# Patient Record
Sex: Male | Born: 1995 | Race: White | Hispanic: No | Marital: Single | Smoking: Never smoker
Health system: Southern US, Community
[De-identification: ages and names within clinical notes are randomized; demographics above are authoritative.]

## PROBLEM LIST (undated history)

## (undated) DIAGNOSIS — F32A Depression, unspecified: Secondary | ICD-10-CM

## (undated) DIAGNOSIS — F329 Major depressive disorder, single episode, unspecified: Secondary | ICD-10-CM

## (undated) DIAGNOSIS — F988 Other specified behavioral and emotional disorders with onset usually occurring in childhood and adolescence: Secondary | ICD-10-CM

## (undated) HISTORY — DX: Depression, unspecified: F32.A

## (undated) HISTORY — DX: Other specified behavioral and emotional disorders with onset usually occurring in childhood and adolescence: F98.8

## (undated) HISTORY — PX: SHOULDER ARTHROSCOPY WITH BICEPS TENDON REPAIR: SHX5674

---
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
## 2017-05-07 ENCOUNTER — Encounter: Payer: Self-pay | Admitting: *Deleted

## 2017-05-07 ENCOUNTER — Emergency Department (INDEPENDENT_AMBULATORY_CARE_PROVIDER_SITE_OTHER)
Admission: EM | Admit: 2017-05-07 | Discharge: 2017-05-07 | Disposition: A | Discharge status: Home / Self Care | Payer: BLUE CROSS/BLUE SHIELD | Source: Home / Self Care

## 2017-05-07 DIAGNOSIS — Z23 Encounter for immunization: Secondary | ICD-10-CM

## 2017-05-07 MED ORDER — TETANUS-DIPHTH-ACELL PERTUSSIS 5-2.5-18.5 LF-MCG/0.5 IM SUSP
0.5000 mL | Freq: Once | INTRAMUSCULAR | Status: AC
  Administered 2017-05-07: 0.5 mL via INTRAMUSCULAR

## 2017-05-07 NOTE — ED Triage Notes (Signed)
Trinna Postlex is here today for Tdap vaccine.

## 2017-05-07 NOTE — ED Notes (Signed)
Pt reports that he stepped on a nail. Advised him to be sure to clean his wound with soap and water daily, and to apply ABT. If he notices any s/s of infection return to the clinic for evaluation. Pt verbalized understanding.

## 2018-05-20 ENCOUNTER — Other Ambulatory Visit: Payer: Self-pay

## 2018-05-20 ENCOUNTER — Emergency Department (INDEPENDENT_AMBULATORY_CARE_PROVIDER_SITE_OTHER)
Admission: EM | Admit: 2018-05-20 | Discharge: 2018-05-20 | Disposition: A | Discharge status: Home / Self Care | Payer: Self-pay | Source: Home / Self Care | Attending: Family Medicine | Admitting: Family Medicine

## 2018-05-20 ENCOUNTER — Emergency Department (INDEPENDENT_AMBULATORY_CARE_PROVIDER_SITE_OTHER): Payer: 59

## 2018-05-20 DIAGNOSIS — R079 Chest pain, unspecified: Secondary | ICD-10-CM

## 2018-05-20 MED ORDER — NAPROXEN 500 MG PO TABS
500.0000 mg | ORAL_TABLET | Freq: Two times a day (BID) | ORAL | 0 refills | Status: DC
Start: 2018-05-20 — End: 2019-04-05

## 2018-05-20 NOTE — ED Triage Notes (Signed)
Pt stated that around 2 pm today his chest started hurting bad on the left side.  He felt like it was hard to take a deep breath.  Denies any history of heart issues.

## 2018-05-20 NOTE — ED Provider Notes (Signed)
[facility]    CSN: [id number] Arrival date & time: 05/20/18  1807     History   Chief Complaint Chief Complaint  Patient presents with  . Chest Pain    HPI Aneesh Faller is a 22 y.o. male.   HPI  Devyn Sheerin is a 22 y.o. male presenting to UC with c/o chest pain that started with a dull ache this morning and worsened around 2PM this afternoon. Pain is aching and sharp with deep breathing, bending over and certain movements or positions. He was able to nap after taking some acetaminophen but pain is still present after the nap. No known heart problems. Denies SOB. Hx of similar pain for a few hours as a child but it resolved on its own.  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 Denies working harder or doing anything different than usual. Denies cough or congestion. Pain is mild at this time but it was severe around 2PM.   History reviewed. No pertinent past medical history.  There are no active problems to display for this patient.   Past Surgical History:  Procedure Laterality Date  . SHOULDER ARTHROSCOPY WITH BICEPS TENDON REPAIR         Home Medications    Prior to Admission medications   Medication Sig Start Date End Date Taking? Authorizing Provider  meloxicam (MOBIC) 15 MG tablet Take 1 tablet (15 mg total) by mouth daily. Take with breakfast 09/13/13   Lattie Haw, MD  naproxen (NAPROSYN) 500 MG tablet Take 1 tablet (500 mg total) by mouth 2 (two) times daily. 05/20/18   Lurene Shadow, PA-C    Family History Family History  Problem Relation Age of Onset  . Hemachromatosis Father     Social History Social History   Tobacco Use  . Smoking status: Never Smoker  Substance Use Topics  . Alcohol use: No  . Drug use: Not on file     Allergies   Patient has no known allergies.   Review of Systems Review of Systems  Constitutional: Negative for chills, diaphoresis, fatigue and fever.  HENT: Negative for congestion and  sore throat.   Respiratory: Negative for chest tightness, shortness of breath and wheezing.   Cardiovascular: Positive for chest pain. Negative for palpitations and leg swelling.  Gastrointestinal: Negative for abdominal pain, diarrhea, nausea and vomiting.  Musculoskeletal: Positive for back pain and myalgias. Negative for neck pain and neck stiffness.  Skin: Negative for rash.     Physical Exam Triage Vital Signs ED Triage Vitals  Enc Vitals Group     BP 05/20/18 1815 124/75     Pulse Rate 05/20/18 1815 70     Resp --      Temp 05/20/18 1815 98.3 F (36.8 C)     Temp Source 05/20/18 1815 Oral     SpO2 05/20/18 1815 98 %     Weight --      Height --      Head Circumference --      Peak Flow --      Pain Score 05/20/18 1816 4     Pain Loc --      Pain Edu? --      Excl. in GC? --    No data found.  Updated Vital Signs BP 124/75 (BP Location: Right Arm)   Pulse 70   Temp 98.3 F (36.8 C) (Oral)   SpO2 98%   Visual Acuity Right Eye Distance:  Left Eye Distance:   Bilateral Distance:    Right Eye Near:   Left Eye Near:    Bilateral Near:     Physical Exam  Constitutional: He is oriented to person, place, and time. He appears well-developed and well-nourished.  Non-toxic appearance. He does not appear ill. No distress.  HENT:  Head: Normocephalic and atraumatic.  Eyes: EOM are normal.  Neck: Normal range of motion.  Cardiovascular: Normal rate, regular rhythm and normal pulses.  Pulses:      Radial pulses are 2+ on the right side, and 2+ on the left side.  Pulmonary/Chest: Effort normal and breath sounds normal. He has no decreased breath sounds. He has no wheezes. He has no rhonchi. He has no rales. He exhibits tenderness.  Mild Left sided chest wall tenderness.  No crepitus or deformity. No ecchymosis or rash.   Musculoskeletal: Normal range of motion.  Neurological: He is alert and oriented to person, place, and time.  Skin: Skin is warm and dry.    Psychiatric: He has a normal mood and affect. His behavior is normal.  Nursing note and vitals reviewed.    UC Treatments / Results  Labs (all labs ordered are listed, but only abnormal results are displayed) Labs Reviewed - No data to display  EKG Date/Time:05/20/2018    18:04:20 Ventricular Rate: 64 PR Interval: 132 QRS Duration: 90 QT Interval: 422 QTC Calculation: 435 P-R-T axes: 57   56   54 Text Interpretation: Normal sinus rhythm. Possible Left atrial enlargement.  Borderline ECG    Radiology Dg Chest 2 View  Result Date: 05/20/2018 CLINICAL DATA:  Chest pain EXAM: CHEST - 2 VIEW COMPARISON:  03/13/2013 FINDINGS: Heart and mediastinal contours are within normal limits. No focal opacities or effusions. No acute bony abnormality. IMPRESSION: No active cardiopulmonary disease. Electronically Signed   By: Charlett Nose M.D.   On: 05/20/2018 18:34    Procedures Procedures (including critical care time)  Medications Ordered in UC Medications - No data to display  Initial Impression / Assessment and Plan / UC Course  I have reviewed the triage vital signs and the nursing notes.  Pertinent labs & imaging results that were available during my care of the patient were reviewed by me and considered in my medical decision making (see chart for details).     Reviewed EKG and CXR with pt- normal Normal vitals Hx and exam most c/w costochondritis Home care instructions and info packet provided. F/u with PCP as needed. Discussed symptoms that warrant emergent care in the ED.  Final Clinical Impressions(s) / UC Diagnoses   Final diagnoses:  Nonspecific chest pain     Discharge Instructions      Please take the medication as prescribed. Follow up with family medicine in 1 week if not improving.  Call 911 or go to the closest hospital if symptoms worsen or new concerning symptoms develop.    ED Prescriptions    Medication Sig Dispense Auth. Provider    naproxen (NAPROSYN) 500 MG tablet Take 1 tablet (500 mg total) by mouth 2 (two) times daily. 30 tablet Lurene Shadow, PA-C     Controlled Substance Prescriptions [place] Controlled Substance Registry consulted? Not Applicable   Rolla Plate 05/20/18 1901

## 2018-05-20 NOTE — Discharge Instructions (Signed)
°  Please take the medication as prescribed. Follow up with family medicine in 1 week if not improving.  Call 911 or go to the closest hospital if symptoms worsen or new concerning symptoms develop.

## 2019-04-05 ENCOUNTER — Ambulatory Visit (INDEPENDENT_AMBULATORY_CARE_PROVIDER_SITE_OTHER): Payer: 59 | Admitting: Physician Assistant

## 2019-04-05 ENCOUNTER — Encounter: Payer: Self-pay | Admitting: Physician Assistant

## 2019-04-05 ENCOUNTER — Other Ambulatory Visit: Payer: Self-pay

## 2019-04-05 VITALS — BP 124/69 | HR 71 | Temp 98.1°F | Ht 73.0 in | Wt 159.0 lb

## 2019-04-05 DIAGNOSIS — F988 Other specified behavioral and emotional disorders with onset usually occurring in childhood and adolescence: Secondary | ICD-10-CM

## 2019-04-05 DIAGNOSIS — Z7689 Persons encountering health services in other specified circumstances: Secondary | ICD-10-CM

## 2019-04-05 DIAGNOSIS — F331 Major depressive disorder, recurrent, moderate: Secondary | ICD-10-CM | POA: Diagnosis not present

## 2019-04-05 DIAGNOSIS — F909 Attention-deficit hyperactivity disorder, unspecified type: Secondary | ICD-10-CM | POA: Insufficient documentation

## 2019-04-05 MED ORDER — METHYLPHENIDATE HCL 10 MG PO TABS: 10.0000 mg | ORAL_TABLET | Freq: Two times a day (BID) | ORAL | 0 refills | Status: DC

## 2019-04-05 NOTE — Progress Notes (Signed)
HPI:                                                                Alexander Prince is a 23 y.o. male who [facility] today to establish care  Current concerns: ADHD and depression  ADHD: reports he used to take Ritalin in elementary school, but it was discontinued due to weight loss. Reports he cannot concentrate on things such as reading a book. Reports he will read one paragraph and cannot recall what he just read. Reports he has trouble expressing his thoughts verbally. Reports he is an over-thinker, mind races and it is hard to slow down or organize his thoughts. Reports he has trouble finishing tasks to completion. Reports he struggled academically both in high school and in college and did not enjoy studying for his degree. He received a degree in computer security, but did not pursue a job in Therapist, artthis field. 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    Diagnosed with depression in 2014. He was prescribed with Prozac, but states he never took it. Reports he recently went through a break-up, but was feeling sad prior to the break-up. Reports feeling sad even when he is in a relationship and is unable to enjoy the present. Reports mind wanders and he has trouble being in the present. Does not enjoy his usual hobbies any more.  Reports he had a mental breakdown 2 weeks ago while on vacation because it did not feel like a vacation and he felt stressed out.  Reports multiple nighttime awakenings (3), but is able to fall back to sleep. Feels tired every day.  He tried fish oil, B12, iron and vitamins and did not notice any improvement. He states he went to therapy when he was a senior in high school; reports parents made him go but he does not remember much about it.   Depression screen PHQ 2/9 04/05/2019  Decreased Interest 3  Down, Depressed, Hopeless 3  PHQ - 2 Score 6   Altered sleeping 2  Tired, decreased energy 2  Change in appetite 0  Feeling bad or failure about yourself  1  Trouble concentrating 3  Moving slowly or fidgety/restless 3  Suicidal thoughts 0  PHQ-9 Score 17   GAD 7 : Generalized Anxiety Score 04/05/2019  Nervous, Anxious, on Edge 3  Control/stop worrying 2  Worry too much - different things 2  Trouble relaxing 3  Restless 2  Easily annoyed or irritable 1  Afraid - awful might happen 0  Total GAD 7 Score 13        Adult ADHD Self Report Scale (most recent)    Adult ADHD Self-Report Scale (ASRS-v1.1) Symptom Checklist - 04/05/19 1027      Part A   1. How often do you have trouble wrapping up the final details of a project, once the challenging parts have been done?  (!) Very Often  2. How often do you have difficulty getting things done in order when you have to do a task that requires organization?  (!) Sometimes    3. How often do you have problems remembering appointments or obligations?  Rarely  4. When you have a task that requires a lot of thought, how often do you avoid or delay getting started?  Sometimes    5. How often do you fidget or squirm with your hands or feet when you have to sit down for a long time?  (!) Often  6. How often do you feel overly active and compelled to do things, like you were driven by a motor?  Rarely      Part B   7. How often do you make careless mistakes when you have to work on a boring or difficult project?  (!) Often  8. How often do you have difficulty keeping your attention when you are doing boring or repetitive work?  (!) Very Often    9. How often do you have difficulty concentrating on what people say to you, even when they are speaking to you directly?  (!) Very Often  10. How often do you misplace or have difficulty finding things at home or at work?  Rarely    11. How often are you distracted by activity or noise around you?  (!) Very Often  12. How often do you leave your seat in  meetings or other situations in which you are expected to remain seated?  (!) Sometimes    13. How often do you feel restless or fidgety?  Sometimes  14. How often do you have difficulty unwinding and relaxing when you have time to yourself?  (!) Very Often    15. How often do you find yourself talking too much when you are in social situations?  Rarely  16. When you are in a conversation, how often do you find yourself finishing the sentences of the people you are talking to, before they can finish them themselves?  (!) Sometimes    17. How often do you have difficulty waiting your turn in situations when turn taking is required?  (!) Very Often  43. How often do you interrupt others when they are busy?  (!) Often      Comment   How old were you when these problems first began to occur?  7         GAD 7 : Generalized Anxiety Score 04/05/2019  Nervous, Anxious, on Edge 3  Control/stop worrying 2  Worry too much - different things 2  Trouble relaxing 3  Restless 2  Easily annoyed or irritable 1  Afraid - awful might happen 0  Total GAD 7 Score 13      Past Medical History:  Diagnosis Date  . ADD (attention deficit disorder)   . Depression    Past Surgical History:  Procedure Laterality Date  . SHOULDER ARTHROSCOPY WITH BICEPS TENDON REPAIR     Social History   Tobacco Use  . Smoking status: Never Smoker  . Smokeless tobacco: Never Used  Substance Use Topics  . Alcohol use: Not Currently   family history includes Hemachromatosis in his father; Hypertension in his father and mother.    ROS: negative except as noted in the HPI  Medications: Current Outpatient Medications  Medication Sig Dispense Refill  . methylphenidate (RITALIN) 10 MG tablet Take 1 tablet (10 mg total) by mouth 2 (two) times daily with breakfast and lunch. One tab in the afternoon/evening as needed. 60 tablet 0   No current facility-administered medications for this visit.    No Known  Allergies     Objective:  BP 124/69   Pulse 71  Temp 98.1 F (36.7 C) (Oral)   Ht 6\' 1"  (1.854 m)   Wt 159 lb (72.1 kg)   BMI 20.98 kg/m  Gen:  alert, not ill-appearing, no distress, appropriate for age HEENT: head normocephalic without obvious abnormality, conjunctiva and cornea clear, trachea midline Pulm: Normal work of breathing, normal phonation, clear to auscultation bilaterally, no wheezes, rales or rhonchi CV: Normal rate, regular rhythm, s1 and s2 distinct, no murmurs, clicks or rubs  Neuro: alert and oriented x 3, no tremor MSK: extremities atraumatic, normal gait and station Skin: intact, no rashes on exposed skin, no jaundice, no cyanosis Psych: cooperative, depressed mood, affect mood-congruent, speech is articulate, normal rate and volume; thought processes clear and goal-directed, normal judgment, good insight, no SI   No results found for this or any previous visit (from the past 72 hour(s)). No results found.    Assessment and Plan: 23 y.o. male with   .Trinna Postlex was seen today for establish care.  Diagnoses and all orders for this visit:  Attention deficit disorder (ADD) in adult -     Discontinue: methylphenidate (RITALIN) 10 MG tablet; Take 1 tablet (10 mg total) by mouth 2 (two) times daily with breakfast and lunch. One tab in the afternoon/evening as needed. -     methylphenidate (RITALIN) 10 MG tablet; Take 1 tablet (10 mg total) by mouth 2 (two) times daily with breakfast and lunch. One tab in the afternoon/evening as needed.  Encounter to establish care  Moderate episode of recurrent major depressive disorder (HCC)  - Personally reviewed PMH, PSH, PFH, medications, allergies, HM - Age-appropriate cancer screening: n/a - Tdap UTD - PHQ2 positive  MDD, ADD Positive ASRS, patient reports a history of childhood ADHD diagnosis and prior treatment with stimulant PHQ9=17, no acute safety issues Patient declined treatment for depression today He  wishes to address ADHD first and see if symptoms improve Discussed potential AE and risks of stimulant  Start Methylphenidate 5 mg bid, self-titrate to 10 mg bid   Patient education and anticipatory guidance given Patient agrees with treatment plan Follow-up in 2 weeks or sooner as needed if symptoms worsen or fail to improve  Levonne Hubertharley E. Macy Polio PA-C

## 2019-04-05 NOTE — Patient Instructions (Addendum)
Make a list of things you would like to discuss at next appointment Start with 1/2 tablet of generic Ritalin (5 mg) and increase to 10 mg as tolerated Take within 30 minutes of starting your day/tasks that require mental focus. May repeat a second dose around lunch time.  Do not drink caffeine while using this medication  Living With Attention Deficit Hyperactivity Disorder If you have been diagnosed with attention deficit hyperactivity disorder (ADHD), you may be relieved that you now know why you have felt or behaved a certain way. Still, you may feel overwhelmed about the treatment ahead. You may also wonder how to get the support you need and how to deal with the condition day-to-day. With treatment and support, you can live with ADHD and manage your symptoms. How to manage lifestyle changes Managing stress Stress is your body's reaction to life changes and events, both good and bad. To cope with the stress of an ADHD diagnosis, it may help to:  Learn more about ADHD.  Exercise regularly. Even a short daily walk can lower stress levels.  Participate in training or education programs (including social skills training classes) that teach you to deal with symptoms.  Medicines Your health care provider may suggest certain medicines if he or she feels that they will help to improve your condition. Stimulant medicines are usually prescribed to treat ADHD, and therapy may also be prescribed. It is important to:  Avoid using alcohol and other substances that may prevent your medicines from working properly [facility](mayinteract).  Talk with your pharmacist or health care provider about all the medicines that you take, their possible side effects, and what medicines are safe to take together.  Make it your goal to take part in all treatment decisions (shared decision-making). Ask about possible side effects of medicines that your health care provider recommends, and tell him or her how you feel about  having those side effects. It is best if shared decision-making with your health care provider is part of your total treatment plan. Relationships To strengthen your relationships with family members while treating your condition, consider taking part in family therapy. You might also attend self-help groups alone or with a loved one. Be honest about how your symptoms affect your relationships. Make an effort to communicate respectfully instead of fighting, and find ways to show others that you care. Psychotherapy may be useful in helping you cope with how ADHD affects your relationships. How to recognize changes in your condition The following signs may mean that your treatment is working well and your condition is improving:  Consistently being on time for appointments.  Being more organized at home and work.  Other people noticing improvements in your behavior.  Achieving goals that you set for yourself.  Thinking more clearly. The following signs may mean that your treatment is not working very well:  Feeling impatience or more confusion.  Missing, forgetting, or being late for appointments.  An increasing sense of disorganization and messiness.  More difficulty in reaching goals that you set for yourself.  Loved ones becoming angry or frustrated with you. Where to find support Talking to others  Keep emotion out of important discussions and speak in a calm, logical way.  Listen closely and patiently to your loved ones. Try to understand their point of view, and try to avoid getting defensive.  Take responsibility for the consequences of your actions.  Ask that others do not take your behaviors personally.  Aim to solve problems as  they come up, and express your feelings instead of bottling them up.  Talk openly about what you need from your loved ones and how they can support you.  Consider going to family therapy sessions or having your family meet with a specialist  who deals with ADHD-related behavior problems. Finances Not all insurance plans cover mental health care, so it is important to check with your insurance carrier. If paying for co-pays or counseling services is a problem, search for a local or county mental health care center. Public mental health care services may be offered there at a low cost or no cost when you are not able to see a private health care provider. If you are taking medicine for ADHD, you may be able to get the generic form, which may be less expensive than brand-name medicine. Some makers of prescription medicines also offer help to patients who cannot afford the medicines that they need. Follow these instructions at home:  Take over-the-counter and prescription medicines only as told by your health care provider. Check with your health care provider before taking any new medicines.  Create structure and an organized atmosphere at home. For example: ? Make a list of tasks, then rank them from most important to least important. Work on one task at a time until your listed tasks are done. ? Make a daily schedule and follow it consistently every day. ? Use an appointment calendar, and check it 2 or 3 times a day to keep on track. Keep it with you when you leave the house. ? Create spaces where you keep certain things, and always put things back in their places after you use them.  Keep all follow-up visits as told by your health care provider. This is important. Questions to ask your health care provider:  What are the risks and benefits of taking medicines?  Would I benefit from therapy?  How often should I follow up with a health care provider? Contact a health care provider if:  You have side effects from your medicines, such as: ? Repeated muscle twitches, coughing, or speech outbursts. ? Sleep problems. ? Loss of appetite. ? Depression. ? New or worsening behavior problems. ? Dizziness. ? Unusually fast  heartbeat. ? Stomach pains. ? Headaches. Get help right away if:  You have a severe reaction to a medicine.  Your behavior suddenly gets worse. Summary  With treatment and support, you can live with ADHD and manage your symptoms.  The medicines that are most often prescribed for ADHD are stimulants.  Consider taking part in family therapy or self-help groups with family members or friends.  When you talk with friends and family about your ADHD, be patient and communicate openly.  Take over-the-counter and prescription medicines only as told by your health care provider. Check with your health care provider before taking any new medicines. This information is not intended to replace advice given to you by your health care provider. Make sure you discuss any questions you have with your health care provider. Document Released: 12/12/2016 Document Revised: 12/04/2018 Document Reviewed: 12/12/2016 Elsevier Patient Education  [place]  After being diagnosed with an anxiety disorder, you may be relieved to know why you have felt or behaved a certain way. It is natural to also feel overwhelmed about the treatment ahead and what it will mean for your life. With care and support, you can manage this condition and recover from it. How to cope with anxiety Dealing  with stress Stress is your bodys reaction to life changes and events, both good and bad. Stress can last just a few hours or it can be ongoing. Stress can play a major role in anxiety, so it is important to learn both how to cope with stress and how to think about it differently. Talk with your health care provider or a counselor to learn more about stress reduction. He or she may suggest some stress reduction techniques, such as:  Music therapy. This can include creating or listening to music that you enjoy and that inspires you.  Mindfulness-based meditation. This involves being aware of your normal  breaths, rather than trying to control your breathing. It can be done while sitting or walking.  Centering prayer. This is a kind of meditation that involves focusing on a word, phrase, or sacred image that is meaningful to you and that brings you peace.  Deep breathing. To do this, expand your stomach and inhale slowly through your nose. Hold your breath for 3-5 seconds. Then exhale slowly, allowing your stomach muscles to relax.  Self-talk. This is a skill where you identify thought patterns that lead to anxiety reactions and correct those thoughts.  Muscle relaxation. This involves tensing muscles then relaxing them. Choose a stress reduction technique that fits your lifestyle and personality. Stress reduction techniques take time and practice. Set aside 5-15 minutes a day to do them. Therapists can offer training in these techniques. The training may be covered by some insurance plans. Other things you can do to manage stress include:  Keeping a stress diary. This can help you learn what triggers your stress and ways to control your response.  Thinking about how you respond to certain situations. You may not be able to control everything, but you can control your reaction.  Making time for activities that help you relax, and not feeling guilty about spending your time in this way. Therapy combined with coping and stress-reduction skills provides the best chance for successful treatment. Medicines Medicines can help ease symptoms. Medicines for anxiety include:  Anti-anxiety drugs.  Antidepressants.  Beta-blockers. Medicines may be used as the main treatment for anxiety disorder, along with therapy, or if other treatments are not working. Medicines should be prescribed by a health care provider. Relationships Relationships can play a big part in helping you recover. Try to spend more time connecting with trusted friends and family members. Consider going to couples counseling, taking  family education classes, or going to family therapy. Therapy can help you and others better understand the condition. How to recognize changes in your condition Everyone has a different response to treatment for anxiety. Recovery from anxiety happens when symptoms decrease and stop interfering with your daily activities at home or work. This may mean that you will start to:  Have better concentration and focus.  Sleep better.  Be less irritable.  Have more energy.  Have improved memory. It is important to recognize when your condition is getting worse. Contact your health care provider if your symptoms interfere with home or work and you do not feel like your condition is improving. Where to find help and support: You can get help and support from these sources:  Self-help groups.  Online and [employer] organizations.  A trusted spiritual leader.  Couples counseling.  Family education classes.  Family therapy. Follow these instructions at home:  Eat a healthy diet that includes plenty of vegetables, fruits, whole grains, low-fat dairy products, and lean protein. Do not  eat a lot of foods that are high in solid fats, added sugars, or salt.  Exercise. Most adults should do the following: ? Exercise for at least 150 minutes each week. The exercise should increase your heart rate and make you sweat (moderate-intensity exercise). ? Strengthening exercises at least twice a week.  Cut down on caffeine, tobacco, alcohol, and other potentially harmful substances.  Get the right amount and quality of sleep. Most adults need 7-9 hours of sleep each night.  Make choices that simplify your life.  Take over-the-counter and prescription medicines only as told by your health care provider.  Avoid caffeine, alcohol, and certain over-the-counter cold medicines. These may make you feel worse. Ask your pharmacist which medicines to avoid.  Keep all follow-up visits as told by your health  care provider. This is important. Questions to ask your health care provider  Would I benefit from therapy?  How often should I follow up with a health care provider?  How long do I need to take medicine?  Are there any long-term side effects of my medicine?  Are there any alternatives to taking medicine? Contact a health care provider if:  You have a hard time staying focused or finishing daily tasks.  You spend many hours a day feeling worried about everyday life.  You become exhausted by worry.  You start to have headaches, feel tense, or have nausea.  You urinate more than normal.  You have diarrhea. Get help right away if:  You have a racing heart and shortness of breath.  You have thoughts of hurting yourself or others. If you ever feel like you may hurt yourself or others, or have thoughts about taking your own life, get help right away. You can go to your nearest emergency department or call:  Your local emergency services (911 in the [place]  A suicide crisis helpline, such as the National Suicide Prevention Lifeline at (931)790-58781-801-788-6084. This is open 24-hours a day. Summary  Taking steps to deal with stress can help calm you.  Medicines cannot cure anxiety disorders, but they can help ease symptoms.  Family, friends, and partners can play a big part in helping you recover from an anxiety disorder. This information is not intended to replace advice given to you by your health care provider. Make sure you discuss any questions you have with your health care provider. Document Released: 08/06/2016 Document Revised: 07/25/2017 Document Reviewed: 08/06/2016 Elsevier Patient Education  2020 [employer]

## 2019-04-19 ENCOUNTER — Other Ambulatory Visit: Payer: Self-pay

## 2019-04-19 ENCOUNTER — Encounter: Payer: Self-pay | Admitting: Physician Assistant

## 2019-04-19 ENCOUNTER — Ambulatory Visit (INDEPENDENT_AMBULATORY_CARE_PROVIDER_SITE_OTHER): Payer: 59 | Admitting: Physician Assistant

## 2019-04-19 VITALS — BP 123/71 | HR 56 | Temp 98.0°F | Wt 160.0 lb

## 2019-04-19 DIAGNOSIS — F331 Major depressive disorder, recurrent, moderate: Secondary | ICD-10-CM | POA: Diagnosis not present

## 2019-04-19 DIAGNOSIS — F988 Other specified behavioral and emotional disorders with onset usually occurring in childhood and adolescence: Secondary | ICD-10-CM | POA: Diagnosis not present

## 2019-04-19 MED ORDER — METHYLPHENIDATE HCL ER (OSM) 27 MG PO TBCR: 27.0000 mg | EXTENDED_RELEASE_TABLET | ORAL | 0 refills | Status: DC

## 2019-04-19 MED ORDER — METHYLPHENIDATE HCL 10 MG PO TABS: ORAL_TABLET | ORAL | 0 refills | Status: DC

## 2019-04-19 NOTE — Patient Instructions (Signed)

## 2019-04-19 NOTE — Progress Notes (Signed)
HPI:                                                                Alexander Prince is a 23 y.o. male who [facility] today to establish care  Current concerns: ADHD and depression  ADHD: reports he used to take Ritalin in elementary school, but it was discontinued due to weight loss. Reports he cannot concentrate on things such as reading a book. Reports he will read one paragraph and cannot recall what he just read. Reports he has trouble expressing his thoughts verbally. Reports he is an over-thinker, mind races and it is hard to slow down or organize his thoughts. Reports he has trouble finishing tasks to completion. Reports he struggled academically both in high school and in college and did not enjoy studying for his degree. He received a degree in computer security, but did not pursue a job in Therapist, artthis field. 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    Diagnosed with depression in 2014. He was prescribed with Prozac, but states he never took it. Reports he recently went through a break-up, but was feeling sad prior to the break-up. Reports feeling sad even when he is in a relationship and is unable to enjoy the present. Reports mind wanders and he has trouble being in the present. Does not enjoy his usual hobbies any more.  Reports he had a mental breakdown 2 weeks ago while on vacation because it did not feel like a vacation and he felt stressed out.  Reports multiple nighttime awakenings (3), but is able to fall back to sleep. Feels tired every day.  He tried fish oil, B12, iron and vitamins and did not notice any improvement. He states he went to therapy when he was a senior in high school; reports parents made him go but he does not remember much about it.   Interval hx 04/19/19 He was started on Methylphenidate and self-titrated to 10 mg bid. He works 6p-12a and  currently take medication at 11a and 5p Reports he has noticed an improvement in his attention and he feels more relaxed, mind is not rushing Reports he has noticed improvement in his sleep, and he is able to fall asleep. Still has several nighttime awakenings, but able to fall back to sleep Notices that he still drifts in and out of conversations and can still be easily distracted Reports noticing medication wearing off in the middle of the day and he does not like having to take multiple doses   [facility] 04/19/2019 04/05/2019  Decreased Interest 1 3  Down, Depressed, Hopeless 3 3  PHQ - 2 Score 4 6  Altered sleeping 2 2  Tired, decreased energy 2 2  Change in appetite 1 0  Feeling bad or failure about yourself  1 1  Trouble concentrating 1 3  Moving slowly or fidgety/restless 1 3  Suicidal thoughts 0 0  PHQ-9 Score 12 17   GAD 7 : Generalized Anxiety Score 04/19/2019 04/05/2019  Nervous, Anxious, on Edge 1 3  Control/stop worrying 2 2  Worry too much - different things 2 2  Trouble relaxing 1 3  Restless 1 2  Easily annoyed or irritable 1 1  Afraid - awful might happen 0 0  Total GAD 7 Score 8 13        Adult ADHD Self Report Scale (most recent)    Adult ADHD Self-Report Scale (ASRS-v1.1) Symptom Checklist   No documentation.     GAD 7 : Generalized Anxiety Score 04/19/2019 04/05/2019  Nervous, Anxious, on Edge 1 3  Control/stop worrying 2 2  Worry too much - different things 2 2  Trouble relaxing 1 3  Restless 1 2  Easily annoyed or irritable 1 1  Afraid - awful might happen 0 0  Total GAD 7 Score 8 13      Past Medical History:  Diagnosis Date  . ADD (attention deficit disorder)   . Depression    Past Surgical History:  Procedure Laterality Date  . SHOULDER ARTHROSCOPY WITH BICEPS TENDON REPAIR     Social History   Tobacco Use  . Smoking status: Never Smoker  . Smokeless tobacco: Never Used  Substance Use Topics  . Alcohol use: Not  Currently   family history includes Hemachromatosis in his father; Hypertension in his father and mother.    ROS: negative except as noted in the HPI  Medications: Current Outpatient Medications  Medication Sig Dispense Refill  . methylphenidate (RITALIN) 10 MG tablet One tab in the afternoon/evening (5-6pm) as needed 60 tablet 0  . methylphenidate 27 MG PO CR tablet Take 1 tablet (27 mg total) by mouth every morning. 30 tablet 0   No current facility-administered medications for this visit.    No Known Allergies     Objective:  BP 123/71   Pulse (!) 56   Temp 98 F (36.7 C) (Oral)   Wt 160 lb (72.6 kg)   BMI 21.11 kg/m    Gen:  alert, not ill-appearing, no distress, appropriate for age 60: head normocephalic without obvious abnormality, conjunctiva and cornea clear, trachea midline Pulm: Normal work of breathing, normal phonation, clear to auscultation bilaterally, no wheezes, rales or rhonchi CV: bradycardic rate, regular rhythm, s1 and s2 distinct, no murmurs, clicks or rubs  Neuro: alert and oriented x 3, no tremor MSK: extremities atraumatic, normal gait and station Skin: intact, no rashes on exposed skin, no jaundice, no cyanosis Psych: cooperative, depressed mood, affect mood-congruent, speech is articulate, normal rate and volume; thought processes clear and goal-directed, normal judgment, good insight, no SI   No results found for this or any previous visit (from the past 72 hour(s)). No results found.    Assessment and Plan: 23 y.o. male with   .Emerick was seen today for medication management.  Diagnoses and all orders for this visit:  Attention deficit disorder (ADD) in adult -     methylphenidate 27 MG PO CR tablet; Take 1 tablet (27 mg total) by mouth every morning. -     methylphenidate (RITALIN) 10 MG tablet; One tab in the afternoon/evening (5-6pm) as needed  Moderate episode of recurrent major depressive disorder (HCC)  -   Adult  ADD Positive ASRS, patient reports a history of childhood ADHD diagnosis and prior treatment with stimulant Good response to Ritalin 10 mg bid, but he prefers once daily dosing Switching to Concerta 27 mg daily, with option to take single dose of Ritalin in the evening prn  MDD PHQ9=12, improved from 17, no acute safety issues Continues to decline treatment for depression  Recommend active surveillance   Patient education and anticipatory guidance given  Patient agrees with treatment plan Follow-up in 4 weeks or sooner as needed if symptoms worsen or fail to improve  Levonne Hubertharley E. Cummings PA-C

## 2019-05-31 ENCOUNTER — Ambulatory Visit (INDEPENDENT_AMBULATORY_CARE_PROVIDER_SITE_OTHER): Payer: 59 | Admitting: Osteopathic Medicine

## 2019-05-31 ENCOUNTER — Other Ambulatory Visit: Payer: Self-pay

## 2019-05-31 ENCOUNTER — Encounter: Payer: Self-pay | Admitting: Osteopathic Medicine

## 2019-05-31 DIAGNOSIS — F988 Other specified behavioral and emotional disorders with onset usually occurring in childhood and adolescence: Secondary | ICD-10-CM | POA: Diagnosis not present

## 2019-05-31 MED ORDER — METHYLPHENIDATE HCL ER (OSM) 36 MG PO TBCR: 36.0000 mg | EXTENDED_RELEASE_TABLET | ORAL | 0 refills | Status: DC

## 2019-05-31 MED ORDER — METHYLPHENIDATE HCL 10 MG PO TABS: ORAL_TABLET | ORAL | 0 refills | Status: DC

## 2019-05-31 NOTE — Patient Instructions (Signed)
Plan: Will increase the Concerta from 27 to 36 mg - new Rx to pharmacy Can continue to add the 10 mg Ritalin as needed - Rx refilled to pharmacy  Will plan on virtual visit for follow-up in 1 month, or reach out to me sooner if needed!

## 2019-05-31 NOTE — Progress Notes (Signed)
HPI: Alexander Prince is a 23 y.o. male who  has a past medical history of ADD (attention deficit disorder) and Depression.  he presents to [facility] today, 05/31/19,  for chief complaint of:  ADD medication follow-up    PDMP reviewed, last filled Ritalin/Methylphenidate 10 mg #60 on 04/05/19, last filled Concerta/Methylphenidate ER 27 mg #30 on 08/24/2 - c/w [name] last note for planning transition to daily dose Rx  Pt reports daily dose is better, he's taking the Ritalin as needed but it's causing some tiredness, he feels the medication overall is better than nothing but he's still having some difficulty.      At today's visit 05/31/19 ... PMH, PSH, FH reviewed and updated as needed.  Current medication list and allergy/intolerance hx reviewed and updated as needed. (See remainder of HPI, ROS, Phys Exam below)   No results found.  No results found for this or any previous visit (from the past 72 hour(s)).        ASSESSMENT/PLAN: The encounter diagnosis was Attention deficit disorder (ADD) in adult.    Meds ordered this encounter  Medications  . methylphenidate 36 MG PO CR tablet    Sig: Take 1 tablet (36 mg total) by mouth every morning.    Dispense:  30 tablet    Refill:  0  . methylphenidate (RITALIN) 10 MG tablet    Sig: One tab daily as needed    Dispense:  30 tablet    Refill:  0    Patient Instructions  Plan: Will increase the Concerta from 27 to 36 mg - new Rx to pharmacy Can continue to add the 10 mg Ritalin as needed - Rx refilled to pharmacy  Will plan on virtual visit for follow-up in 1 month, or reach out to me sooner if needed!      Follow-up plan: Return in about 4 weeks (around 06/28/2019) for virtual visit - ADD follow-up, sooner if needed!  .                                                 ################################################# ################################################# ################################################# #################################################    Current Meds  Medication Sig  . methylphenidate (RITALIN) 10 MG tablet One tab daily as needed  . methylphenidate 36 MG PO CR tablet Take 1 tablet (36 mg total) by mouth every morning.  . [DISCONTINUED] methylphenidate (RITALIN) 10 MG tablet One tab in the afternoon/evening (5-6pm) as needed  . [DISCONTINUED] methylphenidate 27 MG PO CR tablet Take 1 tablet (27 mg total) by mouth every morning.    No Known Allergies     Review of Systems:  Constitutional: No recent illness  HEENT: No  headache  Cardiac: No  chest pain, No  pressure, No palpitations  Respiratory:  No  shortness of breath.  Neurologic: No  weakness, No  Dizziness   Exam:  BP 121/75 (BP Location: Left Arm, Patient Position: Sitting, Cuff Size: Normal)   Pulse (!) 51   Temp 97.7 F (36.5 C) (Oral)   Wt 162 lb 4.8 oz (73.6 kg)   BMI 21.41 kg/m   Constitutional: VS see above. General Appearance: alert, well-developed, well-nourished, NAD  Neck: No masses, trachea midline.   Respiratory: Normal respiratory effort. no wheeze, no rhonchi, no rales  Cardiovascular: S1/S2 normal, no murmur, no rub/gallop auscultated. RRR.   Musculoskeletal:  Gait normal. Symmetric and independent movement of all extremities  Neurological: Normal balance/coordination. No tremor.  Skin: warm, dry, intact.   Psychiatric: Normal judgment/insight. Normal mood and affect. Oriented x3.       Visit summary with medication list and pertinent instructions was printed for patient to review, patient was advised to alert [place] if any updates are needed. All questions at time of visit were answered - patient instructed to contact office  with any additional concerns. ER/RTC precautions were reviewed with the patient and understanding verbalized.   Note: Total time spent 25 minutes, greater than 50% of the visit was spent face-to-face counseling and coordinating care for the following: The encounter diagnosis was Attention deficit disorder (ADD) in adult..  Please note: voice recognition software was used to produce this document, and typos may escape review. Please contact Dr. Sheppard Coil for any needed clarifications.    Follow up plan: Return in about 4 weeks (around 06/28/2019) for virtual visit - ADD follow-up, sooner if needed! Marland Kitchen

## 2019-06-28 ENCOUNTER — Ambulatory Visit (INDEPENDENT_AMBULATORY_CARE_PROVIDER_SITE_OTHER): Payer: 59 | Admitting: Osteopathic Medicine

## 2019-06-28 DIAGNOSIS — Z5329 Procedure and treatment not carried out because of patient's decision for other reasons: Secondary | ICD-10-CM

## 2019-06-28 NOTE — Progress Notes (Signed)
No show

## 2019-06-28 NOTE — Progress Notes (Signed)
Attempted to contact patient at 813 am & 820 am. No answer, left multiple msgs.

## 2019-09-08 ENCOUNTER — Ambulatory Visit (INDEPENDENT_AMBULATORY_CARE_PROVIDER_SITE_OTHER): Payer: 59 | Admitting: Medical-Surgical

## 2019-09-08 ENCOUNTER — Encounter: Payer: Self-pay | Admitting: Medical-Surgical

## 2019-09-08 DIAGNOSIS — J029 Acute pharyngitis, unspecified: Secondary | ICD-10-CM | POA: Diagnosis not present

## 2019-09-08 MED ORDER — LIDOCAINE VISCOUS HCL 2 % MT SOLN: 15.0000 mL | OROMUCOSAL | 0 refills | Status: DC | PRN

## 2019-09-08 MED ORDER — PREDNISONE 50 MG PO TABS: 50.0000 mg | ORAL_TABLET | Freq: Every day | ORAL | 0 refills | Status: DC

## 2019-09-08 NOTE — Progress Notes (Signed)
Alexander Prince is a 24 y.o. male presenting to [facility] today, 09/09/19, seeking care for the following:  Problems pertinent to this visit:  Sore throat:  Positive strep test last week, given amoxicillin and 1 dose steroid.  Fever, loss of taste, sore throat at onset but Covid negative.  Sore throat progressively worsening despite treatment, laryngitis developing.  Pain with swallowing and eating.  No difficulty speaking or drooling.  Reports tonsils swollen but unable to determine if one is larger than the other.  Additional concerns addressed:  None  Assessment & Plan:  Pharyngitis Suspect concha mitten viral infection.  Continue amoxicillin until finished.  Sending in prednisone 50 mg burst dose x5 days and viscous lidocaine as needed.  Reviewed "red flags" for peritonsillar abscess that will require emergency care.  Follow-up instructions: Return if symptoms worsen or fail to improve.  Records reviewed from prior encounters. Records from strep diagnosis appointment unavailable.  There were no vitals taken for this visit.  Current Meds  Medication Sig  . methylphenidate (RITALIN) 10 MG tablet One tab daily as needed  . methylphenidate 36 MG PO CR tablet Take 1 tablet (36 mg total) by mouth every morning.   Thayer Ohm, DNP, APRN, FNP-BC [facility]

## 2019-09-08 NOTE — Assessment & Plan Note (Addendum)
Suspect concomittent viral infection.  Continue amoxicillin until finished.  Sending in prednisone 50 mg burst dose x5 days and viscous lidocaine as needed.  Reviewed "red flags" for peritonsillar abscess that will require emergency care.

## 2019-09-09 ENCOUNTER — Other Ambulatory Visit: Payer: Self-pay

## 2019-09-09 ENCOUNTER — Telehealth: Payer: Self-pay

## 2019-09-09 ENCOUNTER — Emergency Department (INDEPENDENT_AMBULATORY_CARE_PROVIDER_SITE_OTHER)
Admission: EM | Admit: 2019-09-09 | Discharge: 2019-09-09 | Disposition: A | Discharge status: Home / Self Care | Payer: 59 | Source: Home / Self Care

## 2019-09-09 DIAGNOSIS — T360X5A Adverse effect of penicillins, initial encounter: Secondary | ICD-10-CM | POA: Diagnosis not present

## 2019-09-09 DIAGNOSIS — L270 Generalized skin eruption due to drugs and medicaments taken internally: Secondary | ICD-10-CM

## 2019-09-09 DIAGNOSIS — J020 Streptococcal pharyngitis: Secondary | ICD-10-CM

## 2019-09-09 MED ORDER — CEFDINIR 300 MG PO CAPS: 600.0000 mg | ORAL_CAPSULE | Freq: Every day | ORAL | 0 refills | Status: DC

## 2019-09-09 NOTE — Discharge Instructions (Signed)
We are running a mono test that should be back in 2-4 days time.  In the meantime, finish the prednisone and start the new antibiotic  STOP AMOXICILLIN

## 2019-09-09 NOTE — ED Triage Notes (Signed)
Pt c/o rash on his necks, torso and back that broke out at lunchtime. Was seen last week at another clinic, had neg covid and pos strep. Rxd amoxillcin and prednisone. Prednisone was just started yesterday.

## 2019-09-09 NOTE — Telephone Encounter (Signed)
Bird called and states he now has a rash all over his body. He has been treated with antibiotic for strep. He was not tested for strep only treated for strep. I advised patient to go to the urgent care tonight.

## 2019-09-09 NOTE — ED Provider Notes (Signed)
[facility]    CSN: [id number] Arrival date & time: 09/09/19  1555      History   Chief Complaint Chief Complaint  Patient presents with  . Rash    HPI Alexander Prince is a 24 y.o. male.   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  He was put on amoxicillin and steroids but the throat pain worsened.  Today he broke out in a total body rash  Associated with swollen neck glands, fever, and fatigue.     Past Medical History:  Diagnosis Date  . ADD (attention deficit disorder)   . Depression     Patient Active Problem List   Diagnosis Date Noted  . Pharyngitis 09/08/2019  . Attention deficit disorder (ADD) in adult 04/19/2019  . Moderate episode of recurrent major depressive disorder (HCC) 04/19/2019  . ADHD (attention deficit hyperactivity disorder) 04/05/2019    Past Surgical History:  Procedure Laterality Date  . SHOULDER ARTHROSCOPY WITH BICEPS TENDON REPAIR         Home Medications    Prior to Admission medications   Medication Sig Start Date End Date Taking? Authorizing Provider  cefdinir (OMNICEF) 300 MG capsule Take 2 capsules (600 mg total) by mouth daily. 09/09/19   Elvina Sidle, MD  lidocaine (XYLOCAINE) 2 % solution Use as directed 15 mLs in the mouth or throat every 3 (three) hours as needed (mouth/throat pain - gargle and spit). 09/08/19   Christen Butter, NP  methylphenidate (RITALIN) 10 MG tablet One tab daily as needed 05/31/19   Sunnie Nielsen, DO  methylphenidate 36 MG PO CR tablet Take 1 tablet (36 mg total) by mouth every morning. 05/31/19   Sunnie Nielsen, DO  predniSONE (DELTASONE) 50 MG tablet Take 1 tablet (50 mg total) by mouth daily. 09/08/19   Christen Butter, NP    Family History Family History  Problem Relation Age of Onset  . Hypertension Mother   . Hemachromatosis Father   . Hypertension  Father     Social History Social History   Tobacco Use  . Smoking status: Never Smoker  . Smokeless tobacco: Never Used  Substance Use Topics  . Alcohol use: Not Currently  . Drug use: Never     Allergies   Patient has no known allergies.   Review of Systems Review of Systems   Physical Exam Triage Vital Signs ED Triage Vitals  Enc Vitals Group     BP 09/09/19 1622 134/74     Pulse Rate 09/09/19 1622 88     Resp --      Temp 09/09/19 1622 98.2 F (36.8 C)     Temp Source 09/09/19 1622 Oral     SpO2 09/09/19 1622 98 %     Weight 09/09/19 1623 170 lb (77.1 kg)     Height 09/09/19 1623 6\' 1"  (1.854 m)     Head Circumference --      Peak Flow --      Pain Score 09/09/19 1623 0     Pain Loc --      Pain Edu? --      Excl. in GC? --    No data found.  Updated Vital Signs BP 134/74 (BP Location: Right Arm)   Pulse 88   Temp 98.2 F (36.8 C) (Oral)   Ht 6\' 1"  (1.854 m)  Wt 77.1 kg   SpO2 98%   BMI 22.43 kg/m    Physical Exam Vitals reviewed.  Constitutional:      General: He is in acute distress.     Appearance: Normal appearance. He is normal weight. He is not ill-appearing or toxic-appearing.  HENT:     Head: Normocephalic.     Right Ear: Tympanic membrane normal.     Left Ear: Tympanic membrane normal.     Mouth/Throat:     Pharynx: Posterior oropharyngeal erythema present.     Comments: Very red posterior pharynx Eyes:     Conjunctiva/sclera: Conjunctivae normal.  Cardiovascular:     Rate and Rhythm: Normal rate.  Pulmonary:     Effort: Pulmonary effort is normal.     Breath sounds: Normal breath sounds.  Abdominal:     General: Abdomen is flat.     Tenderness: There is no abdominal tenderness.     Comments: Fullness LUQ  Musculoskeletal:        General: Normal range of motion.     Cervical back: Normal range of motion and neck supple.  Lymphadenopathy:     Cervical: Cervical adenopathy present.  Skin:    General: Skin is warm and  dry.     Findings: Rash present.  Neurological:     General: No focal deficit present.     Mental Status: He is alert and oriented to person, place, and time.  Psychiatric:        Mood and Affect: Mood normal.        Behavior: Behavior normal.        Thought Content: Thought content normal.        Judgment: Judgment normal.        UC Treatments / Results  Labs (all labs ordered are listed, but only abnormal results are displayed) Labs Reviewed  EPSTEIN-BARR VIRUS (EBV) ANTIBODY PROFILE  POCT CBC W AUTO DIFF ([place])    EKG   Radiology No results found.  Procedures Procedures (including critical care time)  Medications Ordered in UC Medications - No data to display  Initial Impression / Assessment and Plan / UC Course  I have reviewed the triage vital signs and the nursing notes.  Pertinent labs & imaging results that were available during my care of the patient were reviewed by me and considered in my medical decision making (see chart for details).    Final Clinical Impressions(s) / UC Diagnoses   Final diagnoses:  Strep pharyngitis  Amoxicillin-induced allergic rash     Discharge Instructions     We are running a mono test that should be back in 2-4 days time.  In the meantime, finish the prednisone and start the new antibiotic  STOP AMOXICILLIN      ED Prescriptions    Medication Sig Dispense Auth. Provider   cefdinir (OMNICEF) 300 MG capsule Take 2 capsules (600 mg total) by mouth daily. 20 capsule Robyn Haber, MD     PDMP not reviewed this encounter.   Robyn Haber, MD 09/09/19 1635

## 2019-09-10 ENCOUNTER — Telehealth: Payer: Self-pay | Admitting: *Deleted

## 2019-09-10 LAB — POCT CBC W AUTO DIFF (K'VILLE URGENT CARE)

## 2019-09-10 LAB — EPSTEIN-BARR VIRUS VCA ANTIBODY PANEL
EBV NA IgG: <18 U/mL
EBV VCA IgG: 114 U/mL — ABNORMAL HIGH
EBV VCA IgM: >160 U/mL — ABNORMAL HIGH

## 2019-09-10 LAB — EXTRA LAV TOP TUBE

## 2019-09-10 NOTE — Telephone Encounter (Signed)
Spoke to pt given Mono results per Dr Milus Glazier continue current meds, no more Amoxicillin and s/s should improve over the next 48 hours. He reports feeling much better today other than the rash.

## 2019-09-10 NOTE — Progress Notes (Signed)
This test is positive for mono.  Stay on the current medications.  This is a viral infection that usually last 1 to 2 months.  If you are not improving over the next several days, please return for recheck.

## 2020-03-02 ENCOUNTER — Ambulatory Visit: Payer: 59 | Admitting: Osteopathic Medicine

## 2020-04-11 ENCOUNTER — Ambulatory Visit (INDEPENDENT_AMBULATORY_CARE_PROVIDER_SITE_OTHER): Payer: 59

## 2020-04-11 ENCOUNTER — Other Ambulatory Visit: Payer: Self-pay

## 2020-04-11 ENCOUNTER — Ambulatory Visit (INDEPENDENT_AMBULATORY_CARE_PROVIDER_SITE_OTHER): Payer: 59 | Admitting: Medical-Surgical

## 2020-04-11 ENCOUNTER — Encounter: Payer: Self-pay | Admitting: Medical-Surgical

## 2020-04-11 VITALS — BP 136/79 | HR 55 | Temp 98.3°F | Ht 73.0 in | Wt 171.3 lb

## 2020-04-11 DIAGNOSIS — M79676 Pain in unspecified toe(s): Secondary | ICD-10-CM | POA: Diagnosis not present

## 2020-04-11 DIAGNOSIS — M545 Low back pain, unspecified: Secondary | ICD-10-CM

## 2020-04-11 MED ORDER — SULFAMETHOXAZOLE-TRIMETHOPRIM 800-160 MG PO TABS: 1.0000 | ORAL_TABLET | Freq: Two times a day (BID) | ORAL | 0 refills | Status: AC

## 2020-04-11 MED ORDER — MELOXICAM 15 MG PO TABS: 15.0000 mg | ORAL_TABLET | Freq: Every day | ORAL | 0 refills | Status: DC

## 2020-04-11 NOTE — Progress Notes (Signed)
Subjective:    CC: back pain, toe pain   HPI: Pleasant 24 year old male presenting for evaluation of chronic low back pain and right 5th toe pain.  Back pain- has been experiencing low back pain for several years but has never had this looked into. Notes pain occurs when sitting in the wrong position such as slouching or sitting with his legs propped up. When his back begins to hurt, he is unable to stand up straight and walks in a bent over/hunched position until the pain improves. Usually notes that pain present in the evening is fully resolved when he wakes up the next morning. Notes that bending backwards helps the pain and often lays on his stomach on the floor using his arms to push his upper body up in the "upward dog" yoga position. Takes Ibuprofen 600mg  twice daily but is not sure that this helps at all. Has not tried Tylenol, heat, or ice. Works at and has a physically taxing job. Today, he has no back pain. Denies muscle weakness, saddle paresthesias, and incontinence. Occasionally experiences tingling in the posterior left thigh, usually while lying down in bed.  Toe pain- present for at least 6 months in the right 5th toe on the inner distal medial side. End of the toe has become swollen with a lesion on it. Denies drainage from the lesion. Toe presses against the 4th toe and has caused an indention there. Pressure of the toes together is causing significant pain. His girlfriend is a [employer] and tried wrapping it but that was too painful to tolerate. She also tried using a wart remover on it a couple of days ago which was not helpful. He has gotten a new pair of shoes that do not place as much pressure in the toe box which is helping some.   I reviewed the past medical history, family history, social history, surgical history, and allergies today and no changes were needed.  Please see the problem list section below in epic for further details.  Past Medical History: Past  Medical History:  Diagnosis Date  . ADD (attention deficit disorder)   . Depression    Past Surgical History: Past Surgical History:  Procedure Laterality Date  . SHOULDER ARTHROSCOPY WITH BICEPS TENDON REPAIR     Social History: Social History   Socioeconomic History  . Marital status: Single    Spouse name: Not on file  . Number of children: Not on file  . Years of education: Not on file  . Highest education level: Not on file  Occupational History  . Not on file  Tobacco Use  . Smoking status: Never Smoker  . Smokeless tobacco: Never Used  Vaping Use  . Vaping Use: Never used  Substance and Sexual Activity  . Alcohol use: Not Currently  . Drug use: Never  . Sexual activity: Yes    Birth control/protection: Condom  Other Topics Concern  . Not on file  Social History Narrative  . Not on file   Social Determinants of Health   Financial Resource Strain:   . Difficulty of Paying Living Expenses:   Food Insecurity:   . Worried About Engineer, civil (consulting) in the Last Year:   . Programme researcher, broadcasting/film/video in the Last Year:   Transportation Needs:   . Barista (Medical):   Freight forwarder Lack of Transportation (Non-Medical):   Physical Activity:   . Days of Exercise per Week:   . Minutes of Exercise per Session:  Stress:   . Feeling of Stress :   Social Connections:   . Frequency of Communication with Friends and Family:   . Frequency of Social Gatherings with Friends and Family:   . Attends Religious Services:   . Active Member of Clubs or Organizations:   . Attends Banker Meetings:   Marland Kitchen Marital Status:    Family History: Family History  Problem Relation Age of Onset  . Hypertension Mother   . Hemachromatosis Father   . Hypertension Father    Allergies: Allergies  Allergen Reactions  . Amoxicillin Hives   Medications: See med rec.  Review of Systems: See HPI for pertinent positives and negatives.   Objective:    General: Well Developed, well  nourished, and in no acute distress.  Neuro: Alert and oriented x3. No lower extremity muscle weakness or alteration in sensation. HEENT: Normocephalic, atraumatic.  Skin: Warm and dry. See clinical photo. Cardiac: Regular rate and rhythm, no murmurs rubs or gallops, no lower extremity edema.  Respiratory: Clear to auscultation bilaterally. Not using accessory muscles, speaking in full sentences. MSK: Full ROM to lower spine, hips, and lower extremities. No pain or tenderness.     Impression and Recommendations:    1. Low back pain, unspecified back pain laterality, unspecified chronicity, unspecified whether sciatica present Unable to reproduce symptoms in office. Getting x-rays of the lumbar spine as no previous imaging on file. We will trial meloxicam 15mg  daily to see if this is helpful in reducing the occurrence/severity of his back pain. Discussed possible PT but patient has declined at this time. Advised to follow up with Dr. . if back pain continues and is not helped by meloxicam in 1-2 weeks. - DG Lumbar Spine Complete; Future - meloxicam (MOBIC) 15 MG tablet; Take 1 tablet (15 mg total) by mouth daily. Take daily for 14 days then daily as needed.  Dispense: 30 tablet; Refill: 0  2. Pain of fifth toe Lesion appears to be related to the pressure of his 5th toe against the 4th. Recommend obtaining a soft gel toe separator to reduce the pressure. Can use antibiotic ointment or vaseline on the site to reduce friction. Allergy to Amoxicilling (hives) so avoiding Keflex. Sending in a course of Bactrim due to the swelling and redness, likely a localized cellulitis. Meloxicam daily as discussed above. Also may use Tylenol as needed. Avoid other ibuprofen/NSAID medications while taking Meloxicam.  - meloxicam (MOBIC) 15 MG tablet; Take 1 tablet (15 mg total) by mouth daily. Take daily for 14 days then daily as needed.  Dispense: 30 tablet; Refill: 0  Return for low back pain evaluation with  Dr. Karie Schwalbe. ___________________________________________ Karie Schwalbe, DNP, APRN, FNP-BC Primary Care and Sports Medicine [facility] [place]

## 2020-04-13 ENCOUNTER — Encounter: Payer: Self-pay | Admitting: Medical-Surgical

## 2020-04-20 ENCOUNTER — Encounter: Payer: 59 | Admitting: Sports Medicine

## 2020-05-03 ENCOUNTER — Other Ambulatory Visit: Payer: Self-pay | Admitting: Medical-Surgical

## 2020-05-03 DIAGNOSIS — M545 Low back pain, unspecified: Secondary | ICD-10-CM

## 2020-05-03 DIAGNOSIS — M79676 Pain in unspecified toe(s): Secondary | ICD-10-CM

## 2020-05-07 NOTE — Telephone Encounter (Signed)
Routing to PCP

## 2020-07-04 ENCOUNTER — Encounter: Payer: Self-pay | Admitting: Osteopathic Medicine

## 2020-07-04 ENCOUNTER — Other Ambulatory Visit: Payer: Self-pay

## 2020-07-04 ENCOUNTER — Ambulatory Visit (INDEPENDENT_AMBULATORY_CARE_PROVIDER_SITE_OTHER): Payer: 59 | Admitting: Osteopathic Medicine

## 2020-07-04 VITALS — BP 126/74 | HR 57 | Temp 98.1°F | Wt 184.1 lb

## 2020-07-04 DIAGNOSIS — F900 Attention-deficit hyperactivity disorder, predominantly inattentive type: Secondary | ICD-10-CM

## 2020-07-04 MED ORDER — AMPHETAMINE-DEXTROAMPHET ER 20 MG PO CP24: 20.0000 mg | ORAL_CAPSULE | ORAL | 0 refills | Status: AC

## 2020-07-04 NOTE — Progress Notes (Signed)
Alexander Prince is a 24 y.o. male who presents to  [facility] at [facility]  today, 07/04/20, seeking care for the following:  . Would like to get back on ADHD Rx. Concerta caused mild headaches. Would like to try something else. New job, difficulty focusing at work on calls and meetings.      ASSESSMENT & PLAN with other pertinent findings:  The encounter diagnosis was Attention deficit hyperactivity disorder (ADHD), predominantly inattentive type.   --> trial Adderall XR, consider adding IR. MyChart will message pt next week to see if the XR is lasting sufficiently. Pt to call/message sooner if any questions or problems.   Meds ordered this encounter  Medications  . amphetamine-dextroamphetamine (ADDERALL XR) 20 MG 24 hr capsule    Sig: Take 1 capsule (20 mg total) by mouth every morning.    Dispense:  30 capsule    Refill:  0       Follow-up instructions: Return for FOLLOW UP VIA MYCHART MESSAGE (SET TO SEND NEXT WEEK), IN-OFFICE CHECK-UP IN 4-6 WEEKS - ADHD RX .                                         BP 126/74 (BP Location: Left Arm, Patient Position: Sitting, Cuff Size: Normal)   Pulse (!) 57   Temp 98.1 F (36.7 C) (Oral)   Wt 184 lb 1.9 oz (83.5 kg)   BMI 24.29 kg/m   No outpatient medications have been marked as taking for the 07/04/20 encounter (Office Visit) with Sunnie Nielsen, DO.    No results found for this or any previous visit (from the past 72 hour(s)).  No results found.     All questions at time of visit were answered - patient instructed to contact office with any additional concerns or updates.  ER/RTC precautions were reviewed with the patient as applicable.   Please note: voice recognition software was used to produce this document, and typos may escape review. Please contact Dr. Lyn Hollingshead for any needed clarifications.

## 2020-08-01 ENCOUNTER — Ambulatory Visit: Payer: 59 | Admitting: Osteopathic Medicine

## 2021-11-24 IMAGING — DX DG LUMBAR SPINE COMPLETE 4+V
5 series · 5 of 5 positions shown · non-contrast
Comparison: None.

CLINICAL DATA: Low back pain for 1 year

EXAM:
LUMBAR SPINE - COMPLETE 4+ VIEW

[l-spine ap]
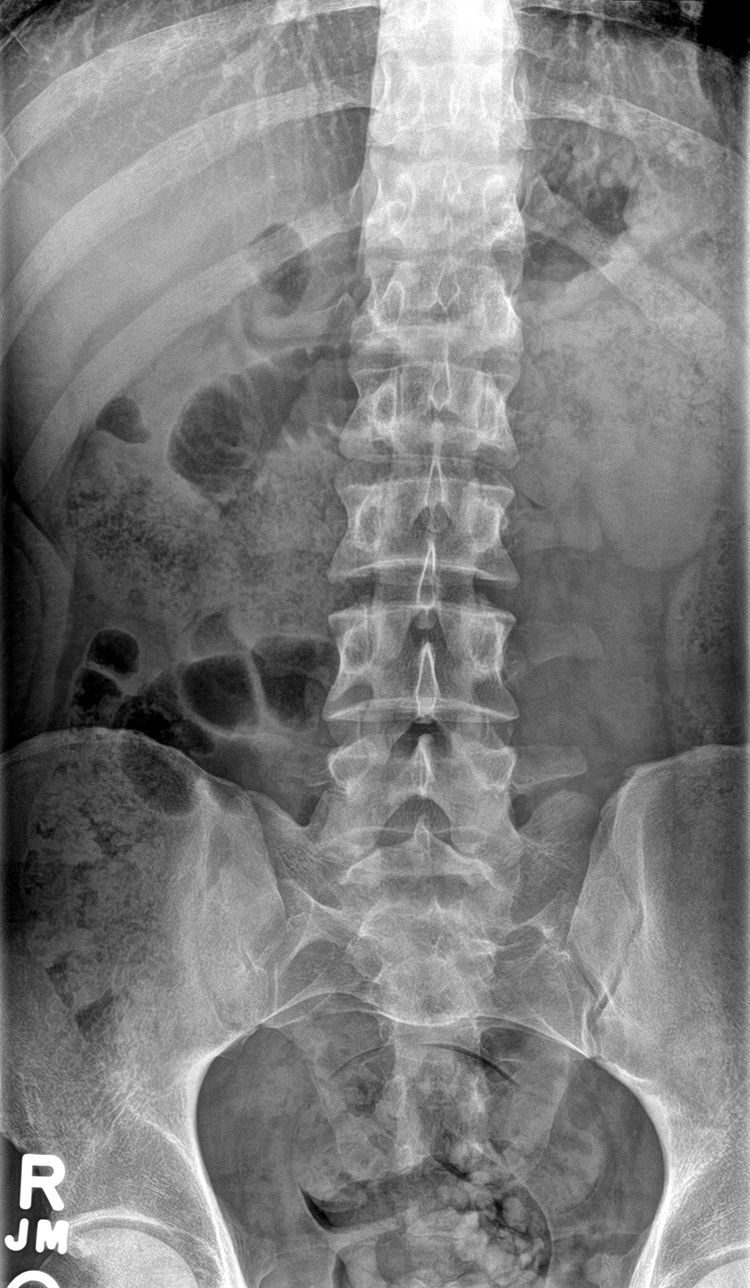

[l-spine obl (1 of 2)]
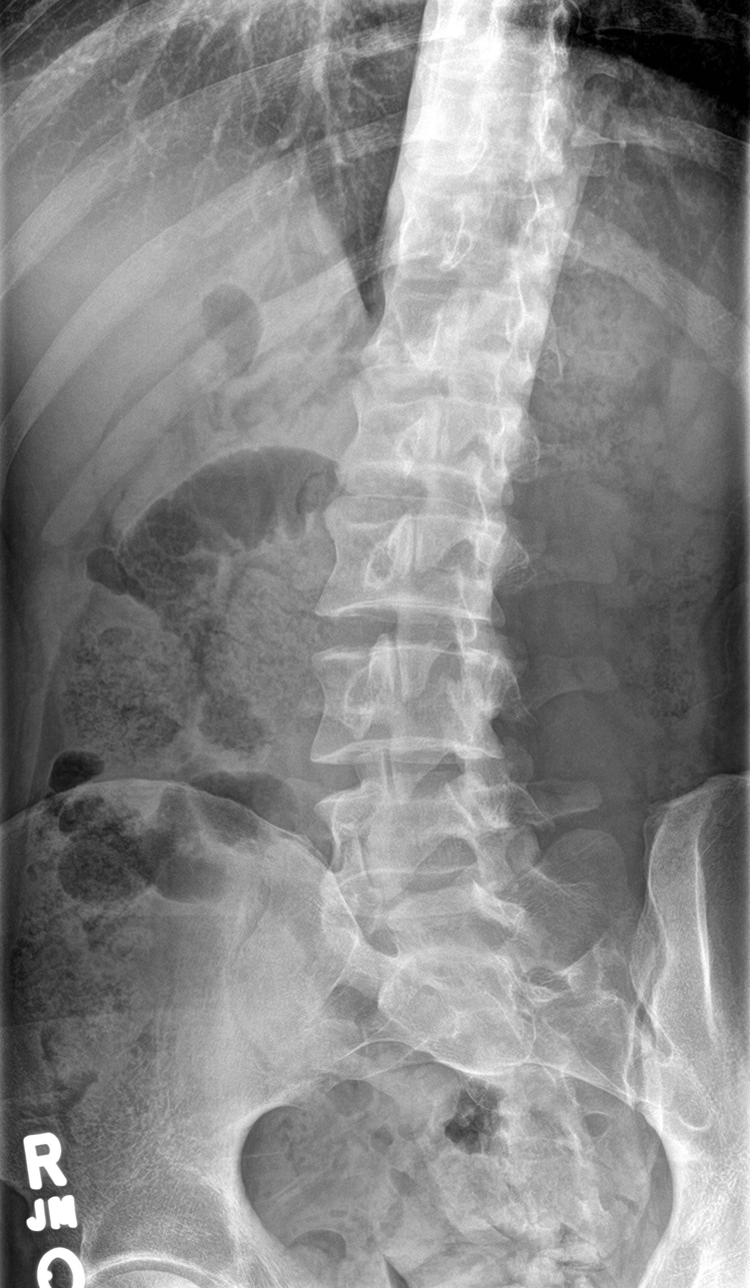

[l-spine obl (2 of 2)]
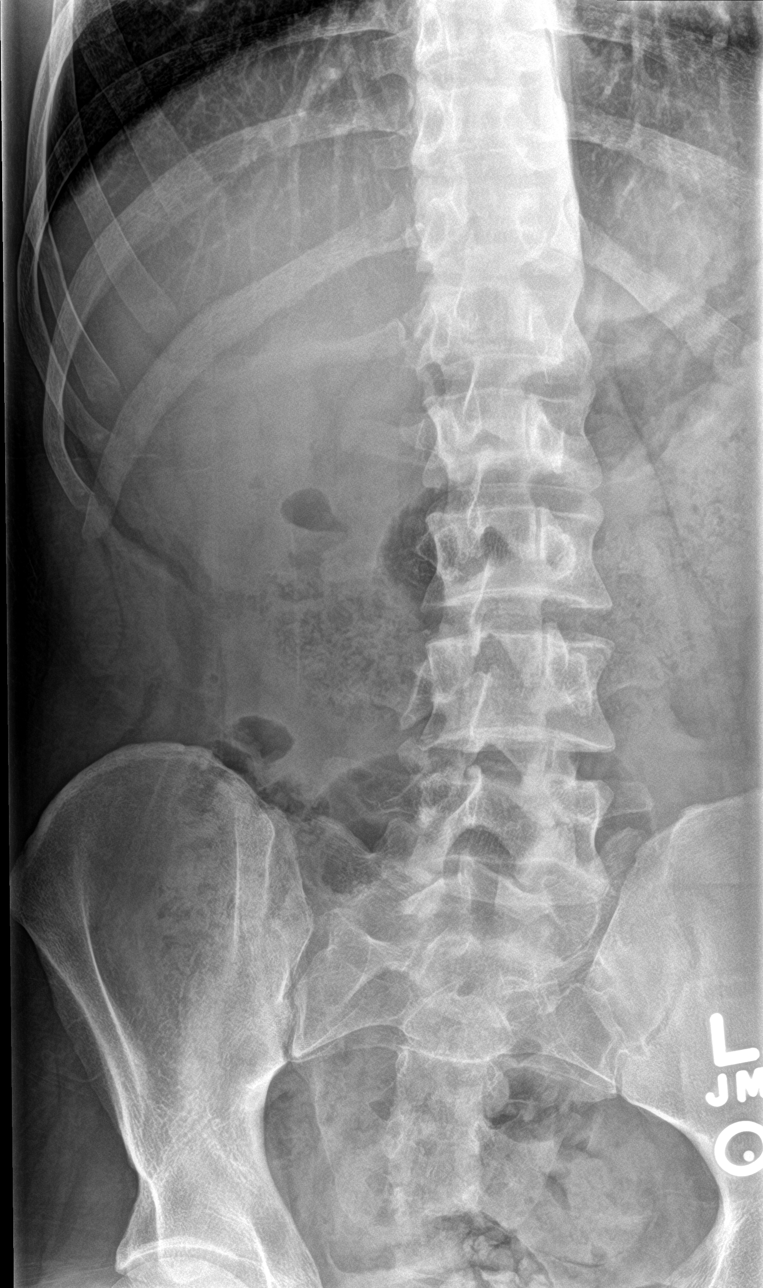

[l-spine lat]
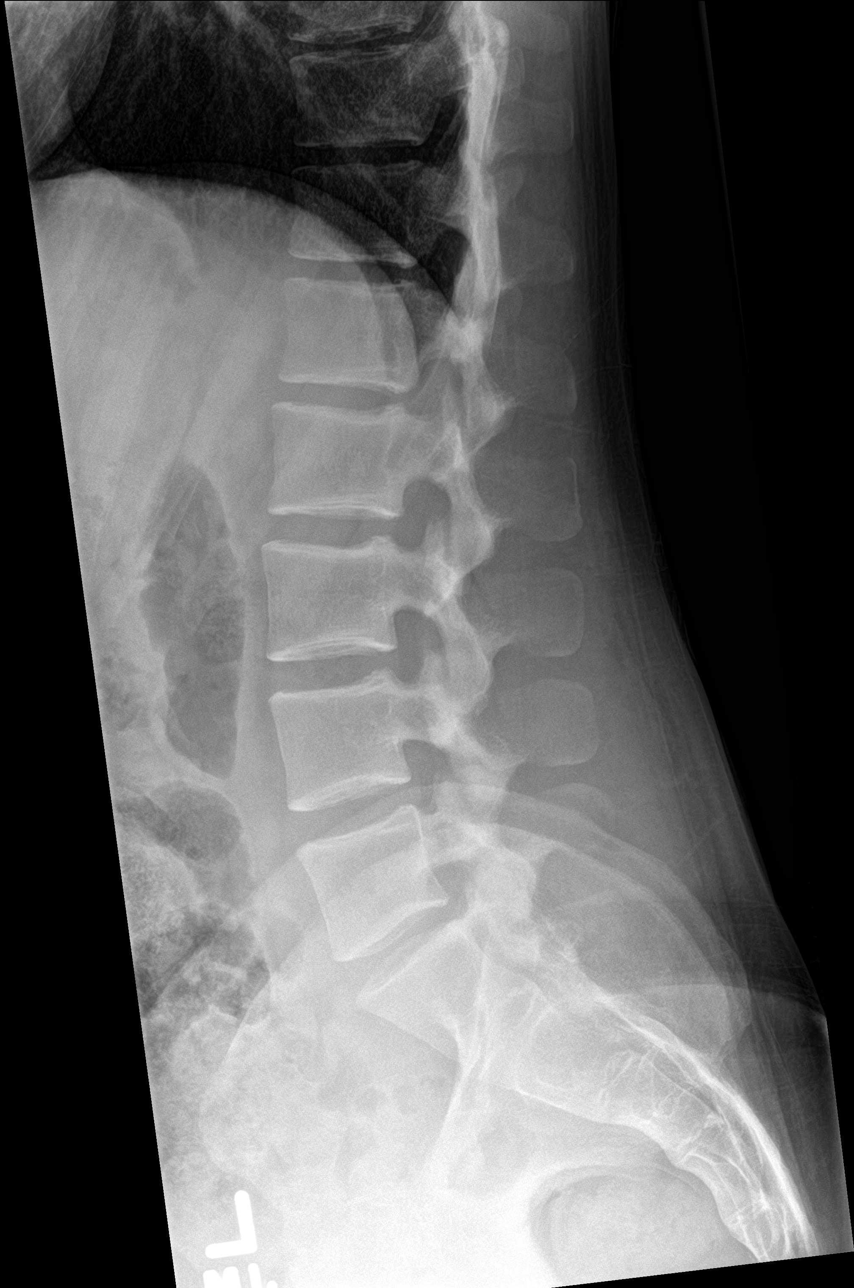

[l-spine spot]
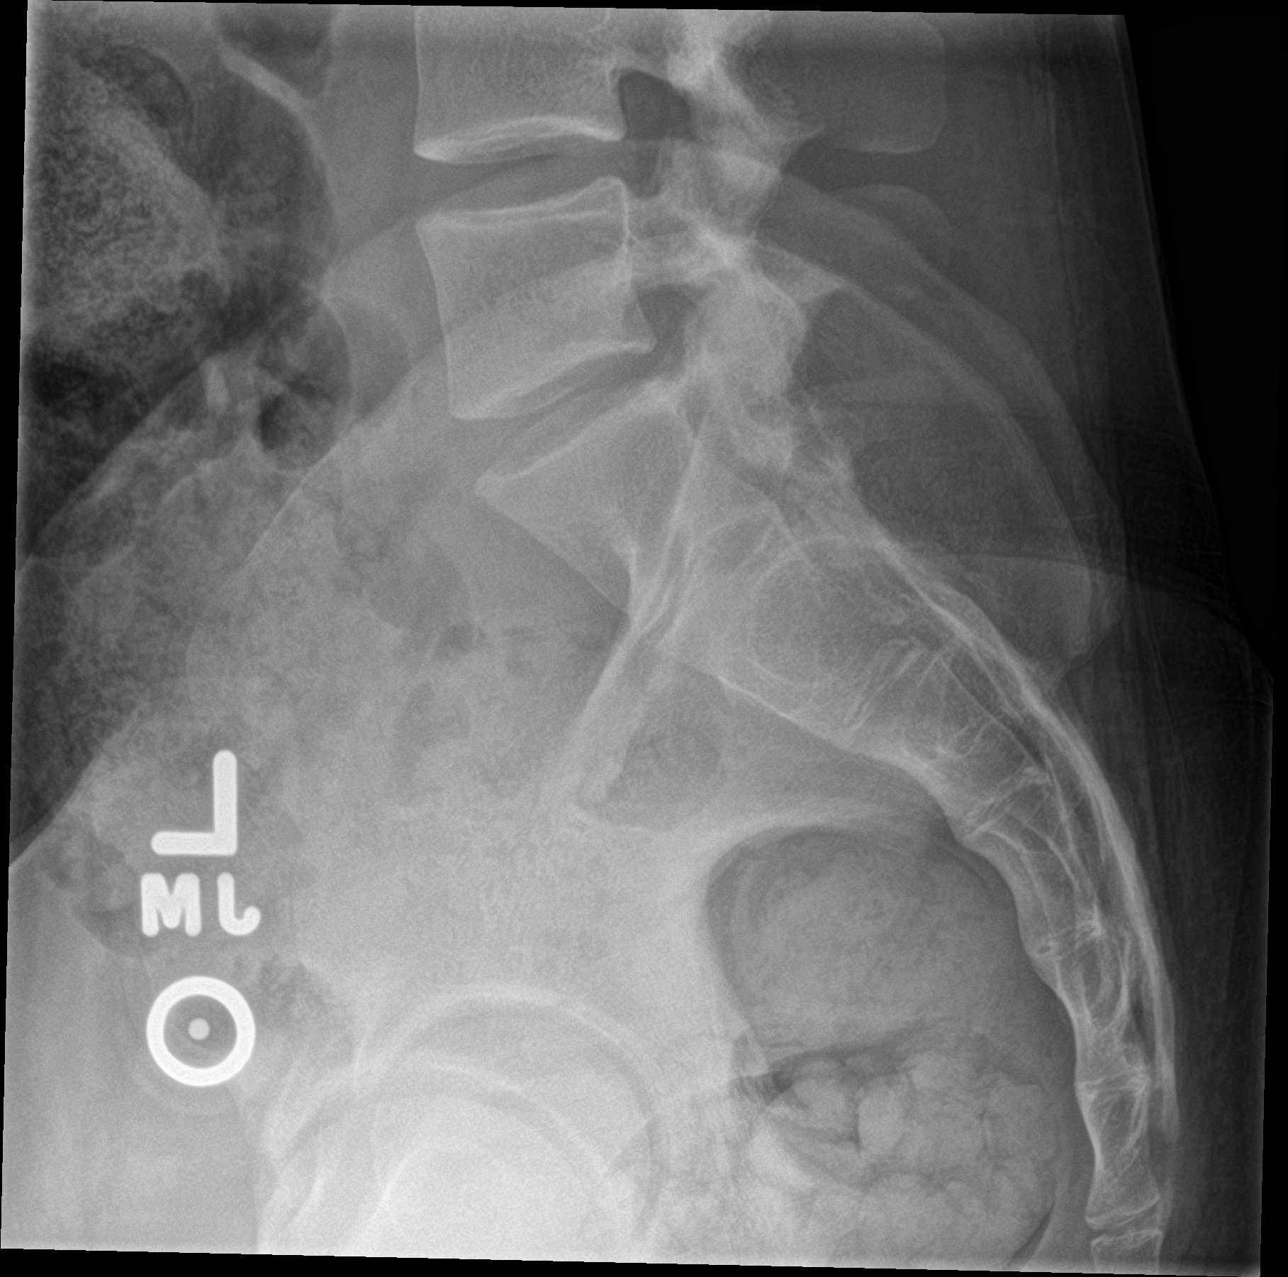

[5 of 5 positions shown; findings below may reference images not displayed]

FINDINGS: There is no evidence of lumbar spine fracture. Alignment is normal.
Intervertebral disc spaces are maintained. Facet joints appear
unremarkable. Moderate-large volume of stool projects throughout the
visualized colon.
IMPRESSION: 1. Unremarkable lumbar spine radiographs.
2. Moderate-large volume of stool throughout the visualized colon.

## 2022-01-09 ENCOUNTER — Encounter: Payer: 59 | Admitting: Medical-Surgical

## 2022-03-15 ENCOUNTER — Ambulatory Visit (INDEPENDENT_AMBULATORY_CARE_PROVIDER_SITE_OTHER): Payer: 59 | Admitting: Medical-Surgical

## 2022-03-15 ENCOUNTER — Encounter: Payer: Self-pay | Admitting: Medical-Surgical

## 2022-03-15 VITALS — BP 117/80 | HR 69 | Resp 20 | Ht 73.0 in | Wt 205.6 lb

## 2022-03-15 DIAGNOSIS — F331 Major depressive disorder, recurrent, moderate: Secondary | ICD-10-CM

## 2022-03-15 DIAGNOSIS — Z7689 Persons encountering health services in other specified circumstances: Secondary | ICD-10-CM | POA: Diagnosis not present

## 2022-03-15 DIAGNOSIS — F900 Attention-deficit hyperactivity disorder, predominantly inattentive type: Secondary | ICD-10-CM | POA: Diagnosis not present

## 2022-03-15 DIAGNOSIS — Z0000 Encounter for general adult medical examination without abnormal findings: Secondary | ICD-10-CM | POA: Diagnosis not present

## 2022-03-15 NOTE — Progress Notes (Signed)
Complete physical exam  Patient: Alexander Prince   DOB: [birth year]   26 y.o. Male  MRN: [id number]  Subjective:    Chief Complaint  Patient presents with   Annual Exam   Transitions Of Care  Transfer of care  Alexander Prince is a 26 y.o. male who presents today for a complete physical exam. He reports consuming a general diet. Gym/ health club routine includes cardio and mod to heavy weightlifting. He generally feels well. He reports sleeping poorly with difficulty with sleep maintenance. He does not have additional problems to discuss today.   Most recent fall risk assessment:    03/15/2022   11:39 AM  Fall Risk   Falls in the past year? 0  Number falls in past yr: 0  Injury with Fall? 0  Risk for fall due to : No Fall Risks  Follow up Falls evaluation completed     Most recent depression screenings:    03/15/2022   11:39 AM 07/04/2020    1:02 PM  PHQ 2/9 Scores  PHQ - 2 Score 2 0  PHQ- 9 Score 6     Vision:Not within last year , Dental: No current dental problems and No regular dental care , and STD: The patient denies history of sexually transmitted disease.    Patient Care Team: Christen Butter, NP as PCP - General (Nurse Practitioner)   Outpatient Medications Prior to Visit  Medication Sig   amphetamine-dextroamphetamine (ADDERALL XR) 20 MG 24 hr capsule Take 1 capsule (20 mg total) by mouth every morning.   meloxicam (MOBIC) 15 MG tablet TAKE 1 TABLET (15 MG TOTAL) BY MOUTH DAILY. TAKE DAILY FOR 14 DAYS THEN DAILY AS NEEDED. (Patient not taking: Reported on 07/04/2020)   sulfamethoxazole-trimethoprim (BACTRIM DS) 800-160 MG tablet Take 1 tablet by mouth 2 (two) times daily. (Patient not taking: Reported on 07/04/2020)   No facility-administered medications prior to visit.   Review of Systems  Constitutional:  Negative for chills, fever, malaise/fatigue and weight loss.  HENT:  Negative for congestion, ear pain, hearing loss, sinus pain and sore throat.   Eyes:  Negative  for blurred vision, photophobia and pain.  Respiratory:  Negative for cough, shortness of breath and wheezing.   Cardiovascular:  Negative for chest pain, palpitations and leg swelling.  Gastrointestinal:  Negative for abdominal pain, constipation, diarrhea, heartburn, nausea and vomiting.  Genitourinary:  Negative for dysuria, frequency and urgency.  Musculoskeletal:  Positive for back pain. Negative for falls and neck pain.  Skin:  Negative for itching and rash.  Neurological:  Negative for dizziness, weakness and headaches.  Endo/Heme/Allergies:  Negative for polydipsia. Does not bruise/bleed easily.  Psychiatric/Behavioral:  Negative for depression, substance abuse and suicidal ideas. The patient has insomnia. The patient is not nervous/anxious.      Objective:    BP 117/80 (BP Location: Right Arm, Cuff Size: Normal)   Pulse 69   Resp 20   Ht 6\' 1"  (1.854 m)   Wt 205 lb 9.6 oz (93.3 kg)   SpO2 97%   BMI 27.13 kg/m    Physical Exam Constitutional:      General: He is not in acute distress.    Appearance: Normal appearance. He is not ill-appearing.  HENT:     Head: Normocephalic and atraumatic.     Right Ear: Tympanic membrane normal.     Left Ear: Tympanic membrane normal.     Nose: Nose normal.     Mouth/Throat:     Mouth:  Mucous membranes are moist.     Pharynx: No oropharyngeal exudate or posterior oropharyngeal erythema.  Eyes:     Extraocular Movements: Extraocular movements intact.     Conjunctiva/sclera: Conjunctivae normal.     Pupils: Pupils are equal, round, and reactive to light.  Neck:     Thyroid: No thyromegaly.     Vascular: No carotid bruit or JVD.     Trachea: Trachea normal.  Cardiovascular:     Rate and Rhythm: Normal rate and regular rhythm.     Pulses: Normal pulses.     Heart sounds: Normal heart sounds. No murmur heard.    No friction rub. No gallop.  Pulmonary:     Effort: Pulmonary effort is normal. No respiratory distress.     Breath  sounds: Normal breath sounds. No wheezing.  Abdominal:     General: Bowel sounds are normal. There is no distension.     Palpations: Abdomen is soft.     Tenderness: There is no abdominal tenderness. There is no guarding.  Musculoskeletal:        General: Normal range of motion.     Cervical back: Normal range of motion and neck supple.  Skin:    General: Skin is warm and dry.  Neurological:     Mental Status: He is alert and oriented to person, place, and time.     Cranial Nerves: No cranial nerve deficit.  Psychiatric:        Mood and Affect: Mood normal.        Behavior: Behavior normal.        Thought Content: Thought content normal.        Judgment: Judgment normal.   No results found for any visits on 03/15/22.     Assessment & Plan:    Routine Health Maintenance and Physical Exam  Immunization History  Administered Date(s) Administered   Janssen (J&J) SARS-COV-2 Vaccination 07/23/2019   PFIZER Comirnaty(Gray Top)Covid-19 Tri-Sucrose Vaccine 09/08/2020   Tdap 08/26/2005, 05/07/2017    Discussed health benefits of physical activity, and encouraged him to engage in regular exercise appropriate for his age and condition.  1. Annual physical exam Checking labs as below.  Would recommend updating preventative care with dental care as well as an updated eye exam.  Wellness information provided with AVS. - CBC with Differential/Platelet - COMPLETE METABOLIC PANEL WITH GFR - Lipid panel  2. Attention deficit hyperactivity disorder (ADHD), predominantly inattentive type Unmedicated and prefers to avoid restarting medication.  3. Moderate episode of recurrent major depressive disorder (HCC) Not currently medicated but reports he is doing well.  If he changes his mind, we can always discuss medication versus counseling or both.  4. Encounter to establish care Reviewed available information and discussed care concerns with patient.   Return in about 1 year (around  03/16/2023) for annual physical exam.   Christen Butter, NP

## 2022-03-16 LAB — CBC WITH DIFFERENTIAL/PLATELET
Absolute Monocytes: 380 cells/uL (ref 200–950)
Basophils Absolute: 10 cells/uL (ref 0–200)
Basophils Relative: 0.2 %
Eosinophils Absolute: 180 cells/uL (ref 15–500)
Eosinophils Relative: 3.6 %
HCT: 46.5 % (ref 38.5–50.0)
Hemoglobin: 15.7 g/dL (ref 13.2–17.1)
Lymphs Abs: 1420 cells/uL (ref 850–3900)
MCH: 28.9 pg (ref 27.0–33.0)
MCHC: 33.8 g/dL (ref 32.0–36.0)
MCV: 85.6 fL (ref 80.0–100.0)
MPV: 11.5 fL (ref 7.5–12.5)
Monocytes Relative: 7.6 %
Neutro Abs: 3010 cells/uL (ref 1500–7800)
Neutrophils Relative %: 60.2 %
Platelets: 262 10*3/uL (ref 140–400)
RBC: 5.43 10*6/uL (ref 4.20–5.80)
RDW: 13.3 % (ref 11.0–15.0)
Total Lymphocyte: 28.4 %
WBC: 5 10*3/uL (ref 3.8–10.8)

## 2022-03-16 LAB — COMPLETE METABOLIC PANEL WITH GFR
AG Ratio: 2.2 (calc) (ref 1.0–2.5)
ALT: 17 U/L (ref 9–46)
AST: 19 U/L (ref 10–40)
Albumin: 4.9 g/dL (ref 3.6–5.1)
Alkaline phosphatase (APISO): 72 U/L (ref 36–130)
BUN: 15 mg/dL (ref 7–25)
CO2: 25 mmol/L (ref 20–32)
Calcium: 10.1 mg/dL (ref 8.6–10.3)
Chloride: 104 mmol/L (ref 98–110)
Creat: 0.97 mg/dL (ref 0.60–1.24)
Globulin: 2.2 g/dL (calc) (ref 1.9–3.7)
Glucose, Bld: 90 mg/dL (ref 65–99)
Potassium: 4.6 mmol/L (ref 3.5–5.3)
Sodium: 140 mmol/L (ref 135–146)
Total Bilirubin: 0.6 mg/dL (ref 0.2–1.2)
Total Protein: 7.1 g/dL (ref 6.1–8.1)
eGFR: 110 mL/min/{1.73_m2} (ref >=60)

## 2022-03-16 LAB — LIPID PANEL
Cholesterol: 174 mg/dL (ref <200)
HDL: 53 mg/dL (ref >=40)
LDL Cholesterol (Calc): 107 mg/dL (calc) — ABNORMAL HIGH
Non-HDL Cholesterol (Calc): 121 mg/dL (calc) (ref <130)
Total CHOL/HDL Ratio: 3.3 (calc) (ref <5.0)
Triglycerides: 62 mg/dL (ref <150)

## 2022-09-23 ENCOUNTER — Telehealth: Payer: 59 | Admitting: Medical-Surgical

## 2023-03-17 ENCOUNTER — Encounter: Payer: 59 | Admitting: Medical-Surgical
